# Patient Record
Sex: Female | Born: 1970 | Hispanic: No | Marital: Married | Smoking: Current every day smoker
Health system: Southern US, Community
[De-identification: ages and names within clinical notes are randomized; demographics above are authoritative.]

---

## 1998-04-13 ENCOUNTER — Other Ambulatory Visit: Admission: RE | Admit: 1998-04-13 | Discharge: 1998-04-13 | Payer: Self-pay | Admitting: Family Medicine

## 2001-11-12 ENCOUNTER — Other Ambulatory Visit: Admission: RE | Admit: 2001-11-12 | Discharge: 2001-11-12 | Payer: Self-pay | Admitting: Unknown Physician Specialty

## 2005-07-31 ENCOUNTER — Other Ambulatory Visit: Admission: RE | Admit: 2005-07-31 | Discharge: 2005-07-31 | Payer: Self-pay | Admitting: Dermatology

## 2005-08-31 ENCOUNTER — Other Ambulatory Visit: Admission: RE | Admit: 2005-08-31 | Discharge: 2005-08-31 | Payer: Self-pay | Admitting: Dermatology

## 2012-11-07 ENCOUNTER — Ambulatory Visit (INDEPENDENT_AMBULATORY_CARE_PROVIDER_SITE_OTHER): Payer: 59 | Admitting: Nurse Practitioner

## 2012-11-07 ENCOUNTER — Encounter: Payer: Self-pay | Admitting: Nurse Practitioner

## 2012-11-07 ENCOUNTER — Telehealth: Payer: Self-pay | Admitting: Nurse Practitioner

## 2012-11-07 VITALS — BP 113/72 | HR 80 | Temp 99.0°F | Ht 62.0 in | Wt 123.5 lb

## 2012-11-07 DIAGNOSIS — J329 Chronic sinusitis, unspecified: Secondary | ICD-10-CM

## 2012-11-07 MED ORDER — HYDROCODONE-HOMATROPINE 5-1.5 MG/5ML PO SYRP: 5.0000 mL | ORAL_SOLUTION | Freq: Three times a day (TID) | ORAL | Status: DC | PRN

## 2012-11-07 MED ORDER — AZITHROMYCIN 250 MG PO TABS: ORAL_TABLET | ORAL | Status: DC

## 2012-11-07 NOTE — Progress Notes (Signed)
  Subjective:    Patient ID: Emily Williams, female    DOB: [birth year], 42 y.o.   MRN: [id number]  HPI-Patient in complaining of Cough and congestion . Started 7 days ago. Has gotten worse since started. Associated symptoms include headache. He has tried [facility] allergy OTC without relief.     Review of Systems  Constitutional: Negative for fever and chills.  HENT: Positive for congestion, rhinorrhea, sneezing and sinus pressure. Negative for ear pain.   Respiratory: Positive for cough (productive green).   Neurological: Positive for headaches.       Objective:   Physical Exam  Constitutional: She appears well-developed and well-nourished.  HENT:  Head: Normocephalic.  Right Ear: Hearing, tympanic membrane, external ear and ear canal normal.  Left Ear: Tympanic membrane, external ear and ear canal normal.  Nose: Mucosal edema and rhinorrhea present. Right sinus exhibits maxillary sinus tenderness and frontal sinus tenderness. Left sinus exhibits maxillary sinus tenderness and frontal sinus tenderness.  Mouth/Throat: Posterior oropharyngeal erythema present.  Eyes: EOM are normal. Pupils are equal, round, and reactive to light.  Cardiovascular: Normal rate, normal heart sounds and intact distal pulses.   Pulmonary/Chest: Effort normal and breath sounds normal.   BP 113/72  Pulse 80  Temp(Src) 99 F (37.2 C) (Oral)  Ht 5\' 2"  (1.575 m)  Wt 123 lb 8 oz (56.019 kg)  BMI 22.58 kg/m2        Assessment & Plan:  1. Rhinosinusitis 1. Take meds as prescribed 2. Use a cool mist humidifier especially during the winter months and when heat has  been humid. 3. Use saline nose sprays frequently 4. Saline irrigations of the nose can be very helpful if done frequently.  * 4X daily for 1 week*  * Use of a nettie pot can be helpful with this. Follow directions with this* 5. Drink plenty of fluids 6. Keep thermostat turn down low 7.For any cough or congestion  Use plain Mucinex-  regular strength or max strength is fine   * Children- consult with Pharmacist for dosing 8. For fever or aces or pains- take tylenol or ibuprofen appropriate for age and weight.  * for fevers greater than 101 orally you may alternate ibuprofen and tylenol every  3 hours.   - azithromycin (ZITHROMAX Z-PAK) 250 MG tablet; As directed  Dispense: 6 each; Refill: 0 - HYDROcodone-homatropine (HYCODAN) 5-1.5 MG/5ML syrup; Take 5 mLs by mouth every 8 (eight) hours as needed for cough.  Dispense: 120 mL; Refill: 0  Emily Daphine Deutscher, FNP

## 2012-11-07 NOTE — Telephone Encounter (Signed)
Appt given for today 

## 2012-11-07 NOTE — Patient Instructions (Signed)

## 2013-05-09 ENCOUNTER — Ambulatory Visit (INDEPENDENT_AMBULATORY_CARE_PROVIDER_SITE_OTHER): Payer: 59 | Admitting: Family Medicine

## 2013-05-09 ENCOUNTER — Encounter: Payer: Self-pay | Admitting: Family Medicine

## 2013-05-09 VITALS — BP 117/68 | HR 83 | Temp 98.3°F | Wt 125.2 lb

## 2013-05-09 DIAGNOSIS — Z7189 Other specified counseling: Secondary | ICD-10-CM

## 2013-05-09 DIAGNOSIS — F17200 Nicotine dependence, unspecified, uncomplicated: Secondary | ICD-10-CM

## 2013-05-09 DIAGNOSIS — Z716 Tobacco abuse counseling: Secondary | ICD-10-CM

## 2013-05-09 DIAGNOSIS — J40 Bronchitis, not specified as acute or chronic: Secondary | ICD-10-CM

## 2013-05-09 DIAGNOSIS — J069 Acute upper respiratory infection, unspecified: Secondary | ICD-10-CM

## 2013-05-09 MED ORDER — METHYLPREDNISOLONE ACETATE 80 MG/ML IJ SUSP
80.0000 mg | Freq: Once | INTRAMUSCULAR | Status: AC
  Administered 2013-05-09: 80 mg via INTRAMUSCULAR

## 2013-05-09 MED ORDER — AZITHROMYCIN 250 MG PO TABS: ORAL_TABLET | ORAL | Status: DC

## 2013-05-09 MED ORDER — BENZONATATE 100 MG PO CAPS: 100.0000 mg | ORAL_CAPSULE | Freq: Three times a day (TID) | ORAL | Status: DC | PRN

## 2013-05-09 NOTE — Patient Instructions (Addendum)
Smoking Cessation Quitting smoking is important to your health and has many advantages. However, it is not always easy to quit since nicotine is a very addictive drug. Often times, people try 3 times or more before being able to quit. This document explains the best ways for you to prepare to quit smoking. Quitting takes hard work and a lot of effort, but you can do it. ADVANTAGES OF QUITTING SMOKING  You will live longer, feel better, and live better.  Your body will feel the impact of quitting smoking almost immediately.  Within 20 minutes, blood pressure decreases. Your pulse returns to its normal level.  After 8 hours, carbon monoxide levels in the blood return to normal. Your oxygen level increases.  After 24 hours, the chance of having a heart attack starts to decrease. Your breath, hair, and body stop smelling like smoke.  After 48 hours, damaged nerve endings begin to recover. Your sense of taste and smell improve.  After 72 hours, the body is virtually free of nicotine. Your bronchial tubes relax and breathing becomes easier.  After 2 to 12 weeks, lungs can hold more air. Exercise becomes easier and circulation improves.  The risk of having a heart attack, stroke, cancer, or lung disease is greatly reduced.  After 1 year, the risk of coronary heart disease is cut in half.  After 5 years, the risk of stroke falls to the same as a nonsmoker.  After 10 years, the risk of lung cancer is cut in half and the risk of other cancers decreases significantly.  After 15 years, the risk of coronary heart disease drops, usually to the level of a nonsmoker.  If you are pregnant, quitting smoking will improve your chances of having a healthy baby.  The people you live with, especially any children, will be healthier.  You will have extra money to spend on things other than cigarettes. QUESTIONS TO THINK ABOUT BEFORE ATTEMPTING TO QUIT You may want to talk about your answers with your  caregiver.  Why do you want to quit?  If you tried to quit in the past, what helped and what did not?  What will be the most difficult situations for you after you quit? How will you plan to handle them?  Who can help you through the tough times? Your family? Friends? A caregiver?  What pleasures do you get from smoking? What ways can you still get pleasure if you quit? Here are some questions to ask your caregiver:  How can you help me to be successful at quitting?  What medicine do you think would be best for me and how should I take it?  What should I do if I need more help?  What is smoking withdrawal like? How can I get information on withdrawal? GET READY  Set a quit date.  Change your environment by getting rid of all cigarettes, ashtrays, matches, and lighters in your home, car, or work. Do not let people smoke in your home.  Review your past attempts to quit. Think about what worked and what did not. GET SUPPORT AND ENCOURAGEMENT You have a better chance of being successful if you have help. You can get support in many ways.  Tell your family, friends, and co-workers that you are going to quit and need their support. Ask them not to smoke around you.  Get individual, group, or telephone counseling and support. Programs are available at local hospitals and health centers. Call your local health department for   information about programs in your area.  Spiritual beliefs and practices may help some smokers quit.  Download a "quit meter" on your computer to keep track of quit statistics, such as how long you have gone without smoking, cigarettes not smoked, and money saved.  Get a self-help book about quitting smoking and staying off of tobacco. LEARN NEW SKILLS AND BEHAVIORS  Distract yourself from urges to smoke. Talk to someone, go for a walk, or occupy your time with a task.  Change your normal routine. Take a different route to work. Drink tea instead of coffee.  Eat breakfast in a different place.  Reduce your stress. Take a hot bath, exercise, or read a book.  Plan something enjoyable to do every day. Reward yourself for not smoking.  Explore interactive web-based programs that specialize in helping you quit. GET MEDICINE AND USE IT CORRECTLY Medicines can help you stop smoking and decrease the urge to smoke. Combining medicine with the above behavioral methods and support can greatly increase your chances of successfully quitting smoking.  Nicotine replacement therapy helps deliver nicotine to your body without the negative effects and risks of smoking. Nicotine replacement therapy includes nicotine gum, lozenges, inhalers, nasal sprays, and skin patches. Some may be available over-the-counter and others require a prescription.  Antidepressant medicine helps people abstain from smoking, but how this works is unknown. This medicine is available by prescription.  Nicotinic receptor partial agonist medicine simulates the effect of nicotine in your brain. This medicine is available by prescription. Ask your caregiver for advice about which medicines to use and how to use them based on your health history. Your caregiver will tell you what side effects to look out for if you choose to be on a medicine or therapy. Carefully read the information on the package. Do not use any other product containing nicotine while using a nicotine replacement product.  RELAPSE OR DIFFICULT SITUATIONS Most relapses occur within the first 3 months after quitting. Do not be discouraged if you start smoking again. Remember, most people try several times before finally quitting. You may have symptoms of withdrawal because your body is used to nicotine. You may crave cigarettes, be irritable, feel very hungry, cough often, get headaches, or have difficulty concentrating. The withdrawal symptoms are only temporary. They are strongest when you first quit, but they will go away within  10 14 days. To reduce the chances of relapse, try to:  Avoid drinking alcohol. Drinking lowers your chances of successfully quitting.  Reduce the amount of caffeine you consume. Once you quit smoking, the amount of caffeine in your body increases and can give you symptoms, such as a rapid heartbeat, sweating, and anxiety.  Avoid smokers because they can make you want to smoke.  Do not let weight gain distract you. Many smokers will gain weight when they quit, usually less than 10 pounds. Eat a healthy diet and stay active. You can always lose the weight gained after you quit.  Find ways to improve your mood other than smoking. FOR MORE INFORMATION  www.smokefree.gov  Document Released: 06/13/2001 Document Revised: 12/19/2011 Document Reviewed: 09/28/2011 [employer] Patient Information 2014 [employer]. Bronchitis Bronchitis is the body's way of reacting to injury and/or infection (inflammation) of the bronchi. Bronchi are the air tubes that extend from the windpipe into the lungs. If the inflammation becomes severe, it may cause shortness of breath. CAUSES  Inflammation may be caused by:  A virus.  Germs (bacteria).  Dust.  Allergens.    Pollutants and many other irritants. The cells lining the bronchial tree are covered with tiny hairs (cilia). These constantly beat upward, away from the lungs, toward the mouth. This keeps the lungs free of pollutants. When these cells become too irritated and are unable to do their job, mucus begins to develop. This causes the characteristic cough of bronchitis. The cough clears the lungs when the cilia are unable to do their job. Without either of these protective mechanisms, the mucus would settle in the lungs. Then you would develop pneumonia. Smoking is a common cause of bronchitis and can contribute to pneumonia. Stopping this habit is the single most important thing you can do to help yourself. TREATMENT   Your caregiver may prescribe an  antibiotic if the cough is caused by bacteria. Also, medicines that open up your airways make it easier to breathe. Your caregiver may also recommend or prescribe an expectorant. It will loosen the mucus to be coughed up. Only take over-the-counter or prescription medicines for pain, discomfort, or fever as directed by your caregiver.  Removing whatever causes the problem (smoking, for example) is critical to preventing the problem from getting worse.  Cough suppressants may be prescribed for relief of cough symptoms.  Inhaled medicines may be prescribed to help with symptoms now and to help prevent problems from returning.  For those with recurrent (chronic) bronchitis, there may be a need for steroid medicines. SEEK IMMEDIATE MEDICAL CARE IF:   During treatment, you develop more pus-like mucus (purulent sputum).  You have a fever.  You become progressively more ill.  You have increased difficulty breathing, wheezing, or shortness of breath. It is necessary to seek immediate medical care if you are elderly or sick from any other disease. MAKE SURE YOU:   Understand these instructions.  Will watch your condition.  Will get help right away if you are not doing well or get worse. Document Released: 06/19/2005 Document Revised: 02/19/2013 Document Reviewed: 02/11/2013 [employer] Patient Information 2014 [employer].  

## 2013-05-09 NOTE — Progress Notes (Signed)
  Subjective:    Patient ID: Emily Williams, female    DOB: 09-30-1970, 42 y.o.   MRN: [id number]  HPI URI Symptoms Onset: 5-6 days  Description: rhinorrhea, nasal congestion, cough, wheezing  Modifying factors:  1 ppd smoker   Symptoms Nasal discharge: yes Fever: no Sore throat: minimal  Cough: yes Wheezing: yes Ear pain: no GI symptoms: no Sick contacts: yes  Red Flags  Stiff neck: no Dyspnea: no Rash: no Swallowing difficulty: no  Sinusitis Risk Factors Headache/face pain: no Double sickening: no tooth pain: no  Allergy Risk Factors Sneezing: no Itchy scratchy throat: no Seasonal symptoms: no  Flu Risk Factors Headache: no muscle aches: no severe fatigue: no     Review of Systems  All other systems reviewed and are negative.       Objective:   Physical Exam  Constitutional: She appears well-developed and well-nourished.  HENT:  Head: Normocephalic and atraumatic.  Eyes: Conjunctivae are normal. Pupils are equal, round, and reactive to light.  Neck: Normal range of motion. Neck supple.  Cardiovascular: Normal rate and regular rhythm.   Pulmonary/Chest: Effort normal.  Faint wheezes in apices    Abdominal: Soft.  Musculoskeletal: Normal range of motion.  Neurological: She is alert.  Skin: Skin is warm.          Assessment & Plan:  URI (upper respiratory infection)  Bronchitis - Plan: benzonatate (TESSALON) 100 MG capsule, azithromycin (ZITHROMAX) 250 MG tablet  Suspect likely viral process with secondary obstructive lung disease exacerbation (25+ pack year smoking history with wheezing).  Depomedrol 80mg  IM x1.  Wheezing mild with no resp distress/increased WOB Tessalon perles for cough Zpak if sxs fail to improve in 5-7 days or worsen.  Discussed smoking cessation at length.  Follow up as needed.

## 2013-05-09 NOTE — Addendum Note (Signed)
Addended by: Gwenith Daily on: 05/09/2013 11:15 AM   Modules accepted: Orders

## 2013-09-08 ENCOUNTER — Encounter: Payer: Self-pay | Admitting: Family Medicine

## 2013-09-08 ENCOUNTER — Ambulatory Visit (INDEPENDENT_AMBULATORY_CARE_PROVIDER_SITE_OTHER): Payer: 59 | Admitting: Family Medicine

## 2013-09-08 ENCOUNTER — Encounter (INDEPENDENT_AMBULATORY_CARE_PROVIDER_SITE_OTHER): Payer: Self-pay

## 2013-09-08 VITALS — BP 107/62 | HR 90 | Temp 99.0°F | Ht 62.0 in | Wt 121.8 lb

## 2013-09-08 DIAGNOSIS — K649 Unspecified hemorrhoids: Secondary | ICD-10-CM

## 2013-09-08 DIAGNOSIS — R197 Diarrhea, unspecified: Secondary | ICD-10-CM

## 2013-09-08 MED ORDER — HYDROCORTISONE ACETATE 25 MG RE SUPP: 25.0000 mg | Freq: Two times a day (BID) | RECTAL | Status: AC

## 2013-09-08 MED ORDER — DIPHENOXYLATE-ATROPINE 2.5-0.025 MG PO TABS: 2.0000 | ORAL_TABLET | Freq: Four times a day (QID) | ORAL | Status: AC | PRN

## 2013-09-08 NOTE — Progress Notes (Signed)
   Subjective:    Patient ID: Emily Williams, female    DOB: 07-29-1970, 43 y.o.   MRN: [id number]  HPI This 43 y.o. female presents for evaluation of diarrhea and rectal bleeding for 2 days.   Review of Systems C/o rectal bleeding and diarrhea No chest pain, SOB, HA, dizziness, vision change, N/V, constipation, dysuria, urinary urgency or frequency, myalgias, arthralgias or rash.     Objective:   Physical Exam Vital signs noted  Well developed well nourished female.  HEENT - Head atraumatic Normocephalic                Eyes - PERRLA, Conjuctiva - clear Sclera- Clear EOMI                Ears - EAC's Wnl TM's Wnl Gross Hearing WNL                Throat - oropharanx wnl Respiratory - Lungs CTA bilateral Cardiac - RRR S1 and S2 without murmur GI - Abdomen soft Nontender and bowel sounds active x 4 Rectal - deferred       Assessment & Plan:  Hemorrhoids - Plan: hydrocortisone (ANUSOL-HC) 25 MG suppository  Diarrhea - Plan: diphenoxylate-atropine (LOMOTIL) 2.5-0.025 MG per tablet  If rectal bleeding persists then follow up  Deatra CanterWilliam J Shere Eisenhart FNP

## 2013-09-10 ENCOUNTER — Ambulatory Visit (INDEPENDENT_AMBULATORY_CARE_PROVIDER_SITE_OTHER): Payer: 59 | Admitting: Family Medicine

## 2013-09-10 ENCOUNTER — Encounter: Payer: Self-pay | Admitting: Family Medicine

## 2013-09-10 ENCOUNTER — Telehealth: Payer: Self-pay | Admitting: Family Medicine

## 2013-09-10 VITALS — BP 100/55 | HR 84 | Temp 99.4°F | Ht 62.0 in | Wt 121.2 lb

## 2013-09-10 DIAGNOSIS — R5381 Other malaise: Secondary | ICD-10-CM

## 2013-09-10 DIAGNOSIS — K625 Hemorrhage of anus and rectum: Secondary | ICD-10-CM

## 2013-09-10 DIAGNOSIS — R531 Weakness: Secondary | ICD-10-CM

## 2013-09-10 DIAGNOSIS — R5383 Other fatigue: Secondary | ICD-10-CM

## 2013-09-10 DIAGNOSIS — R197 Diarrhea, unspecified: Secondary | ICD-10-CM

## 2013-09-10 DIAGNOSIS — K649 Unspecified hemorrhoids: Secondary | ICD-10-CM

## 2013-09-10 LAB — POCT CBC
Granulocyte percent: 61.2 %G (ref 37–80)
HCT, POC: 42.7 % (ref 37.7–47.9)
Hemoglobin: 13.6 g/dL (ref 12.2–16.2)
Lymph, poc: 3.4 (ref 0.6–3.4)
MCH, POC: 29.8 pg (ref 27–31.2)
MCHC: 31.9 g/dL (ref 31.8–35.4)
MCV: 93.4 fL (ref 80–97)
MPV: 6.7 fL (ref 0–99.8)
POC Granulocyte: 6.1 (ref 2–6.9)
POC LYMPH PERCENT: 33.9 %L (ref 10–50)
Platelet Count, POC: 331 10*3/uL (ref 142–424)
RBC: 4.6 M/uL (ref 4.04–5.48)
RDW, POC: 12.5 %
WBC: 9.9 10*3/uL (ref 4.6–10.2)

## 2013-09-10 MED ORDER — HYOSCYAMINE SULFATE 0.125 MG SL SUBL: 0.1250 mg | SUBLINGUAL_TABLET | SUBLINGUAL | Status: AC | PRN

## 2013-09-10 NOTE — Telephone Encounter (Signed)
Appt scheduled for today. Patient aware.

## 2013-09-10 NOTE — Progress Notes (Signed)
   Subjective:    Patient ID: Emily Williams, female    DOB: 1971-01-28, 43 y.o.   MRN: [id number]  HPI  This 43 y.o. female presents for evaluation of rectal bleeding and diarrhea.  She has been taking lomotil because she was having more than 6 bm's a day and she is only having 3 bm's now but she is weak and she wiped and had blood on her tissue.  Review of Systems C/o rectal bleeding and diarrhea   No chest pain, SOB, HA, dizziness, vision change, N/V, diarrhea, constipation, dysuria, urinary urgency or frequency, myalgias, arthralgias or rash.  Objective:   Physical Exam  Vital signs noted  Well developed well nourished female.  HEENT - Head atraumatic Normocephalic                Eyes - PERRLA, Conjuctiva - clear Sclera- Clear EOMI                Ears - EAC's Wnl TM's Wnl Gross Hearing WNL                Throat - oropharanx wnl Respiratory - Lungs CTA bilateral Cardiac - RRR S1 and S2 without murmur GI - Abdomen soft Nontender and bowel sounds active x 4 Extremities - No edema. Neuro - Grossly intact.      Assessment & Plan:  Hemorrhoids - Plan: POCT CBC, CMP14+EGFR, hyoscyamine (LEVSIN/SL) 0.125 MG SL tablet  Weakness - Plan: POCT CBC, CMP14+EGFR, hyoscyamine (LEVSIN/SL) 0.125 MG SL tablet  Diarrhea - Plan: POCT CBC, CMP14+EGFR, hyoscyamine (LEVSIN/SL) 0.125 MG SL tablet  Rectal bleeding - Plan: POCT CBC, CMP14+EGFR, hyoscyamine (LEVSIN/SL) 0.125 MG SL tablet  Follow up prn  Lysbeth Penner FNP

## 2013-09-11 LAB — CMP14+EGFR
ALT: 12 IU/L (ref 0–32)
AST: 20 IU/L (ref 0–40)
Albumin/Globulin Ratio: 2.8 — ABNORMAL HIGH (ref 1.1–2.5)
Albumin: 4.7 g/dL (ref 3.5–5.5)
Alkaline Phosphatase: 81 IU/L (ref 39–117)
BUN/Creatinine Ratio: 11 (ref 9–23)
BUN: 8 mg/dL (ref 6–24)
CO2: 23 mmol/L (ref 18–29)
Calcium: 9.5 mg/dL (ref 8.7–10.2)
Chloride: 100 mmol/L (ref 97–108)
Creatinine, Ser: 0.73 mg/dL (ref 0.57–1.00)
GFR calc Af Amer: 116 mL/min/{1.73_m2} (ref >59)
GFR calc non Af Amer: 100 mL/min/{1.73_m2} (ref >59)
Globulin, Total: 1.7 g/dL (ref 1.5–4.5)
Glucose: 81 mg/dL (ref 65–99)
Potassium: 3.7 mmol/L (ref 3.5–5.2)
Sodium: 140 mmol/L (ref 134–144)
Total Bilirubin: 0.5 mg/dL (ref 0.0–1.2)
Total Protein: 6.4 g/dL (ref 6.0–8.5)

## 2013-09-16 DIAGNOSIS — Z0289 Encounter for other administrative examinations: Secondary | ICD-10-CM

## 2013-09-29 ENCOUNTER — Telehealth: Payer: Self-pay | Admitting: Family Medicine

## 2013-09-29 NOTE — Telephone Encounter (Signed)
Ready, patient aware

## 2014-03-30 ENCOUNTER — Encounter (INDEPENDENT_AMBULATORY_CARE_PROVIDER_SITE_OTHER): Payer: Self-pay

## 2014-03-30 ENCOUNTER — Ambulatory Visit (INDEPENDENT_AMBULATORY_CARE_PROVIDER_SITE_OTHER): Payer: 59 | Admitting: Family Medicine

## 2014-03-30 ENCOUNTER — Encounter: Payer: Self-pay | Admitting: Family Medicine

## 2014-03-30 VITALS — BP 115/71 | HR 83 | Temp 99.0°F | Ht 62.0 in | Wt 119.6 lb

## 2014-03-30 DIAGNOSIS — M545 Low back pain, unspecified: Secondary | ICD-10-CM

## 2014-03-30 DIAGNOSIS — Z23 Encounter for immunization: Secondary | ICD-10-CM

## 2014-03-30 MED ORDER — CYCLOBENZAPRINE HCL 10 MG PO TABS: 10.0000 mg | ORAL_TABLET | Freq: Three times a day (TID) | ORAL | Status: AC | PRN

## 2014-03-30 MED ORDER — KETOROLAC TROMETHAMINE 30 MG/ML IJ SOLN
30.0000 mg | Freq: Once | INTRAMUSCULAR | Status: AC
  Administered 2014-03-30: 30 mg via INTRAMUSCULAR

## 2014-03-30 NOTE — Progress Notes (Signed)
   Subjective:    Patient ID: Emily Williams, female    DOB: 01/10/1971, 43 y.o.   MRN: [id number]  HPI This 43 y.o. female presents for evaluation of left sided lumbar back pain.  She has been taking motrin otc and is not getting better.  She works as a Doctor, hospital.  She is not able to do her job because the pain is severe and she is having difficulty walking..   Review of Systems C/o back pain   No chest pain, SOB, HA, dizziness, vision change, N/V, diarrhea, constipation, dysuria, urinary urgency or frequency or rash.  Objective:   Physical Exam  Vital signs noted  Well developed well nourished female.  HEENT - Head atraumatic Normocephalic Respiratory - Lungs CTA bilateral Cardiac - RRR S1 and S2 without murmur GI - Abdomen soft Nontender and bowel sounds active x 4 MS - TTP lumbar spine on left, decreased ROM lumbar spine.      Assessment & Plan:  Left-sided low back pain without sciatica - Plan: cyclobenzaprine (FLEXERIL) 10 MG tablet, ketorolac (TORADOL) 30 MG/ML injection 30 mg  Need for prophylactic vaccination and inoculation against influenza  Continue motrin otc and take 3 days off and follow up if not better.  Deatra Canter FNP

## 2014-04-02 ENCOUNTER — Ambulatory Visit (INDEPENDENT_AMBULATORY_CARE_PROVIDER_SITE_OTHER): Payer: 59 | Admitting: Family Medicine

## 2014-04-02 ENCOUNTER — Encounter: Payer: Self-pay | Admitting: Family Medicine

## 2014-04-02 ENCOUNTER — Ambulatory Visit (INDEPENDENT_AMBULATORY_CARE_PROVIDER_SITE_OTHER): Payer: 59

## 2014-04-02 VITALS — BP 102/64 | HR 96 | Temp 98.4°F | Ht 62.0 in | Wt 119.0 lb

## 2014-04-02 DIAGNOSIS — M545 Low back pain, unspecified: Secondary | ICD-10-CM

## 2014-04-02 NOTE — Progress Notes (Signed)
   Subjective:    Patient ID: Emily GrammesAngela Kemppainen, female    DOB: [birth year], 43 y.o.   MRN: [id number]  HPI  C/o back pain.  She was seen 3 days ago and states she is not better.  She would like work note for 2 more days.  Review of Systems    No chest pain, SOB, HA, dizziness, vision change, N/V, diarrhea, constipation, dysuria, urinary urgency or frequency, myalgias, arthralgias or rash.  Objective:   Physical Exam  MS - Decreased ROM LS spine and TTP left lumbar muscles      Assessment & Plan:  Left-sided low back pain without sciatica - Plan: DG Lumbar Spine 2-3 Views, Ambulatory referral to Physical Therapy  Deatra CanterWilliam J Dnya Hickle FNP

## 2014-04-06 DIAGNOSIS — Z6379 Other stressful life events affecting family and household: Secondary | ICD-10-CM

## 2014-04-08 ENCOUNTER — Ambulatory Visit: Payer: 59 | Attending: Family Medicine | Admitting: Physical Therapy

## 2014-04-08 DIAGNOSIS — M5440 Lumbago with sciatica, unspecified side: Secondary | ICD-10-CM | POA: Insufficient documentation

## 2014-04-08 DIAGNOSIS — Z5189 Encounter for other specified aftercare: Secondary | ICD-10-CM | POA: Insufficient documentation

## 2014-04-16 ENCOUNTER — Ambulatory Visit: Payer: 59 | Admitting: Physical Therapy

## 2014-04-16 DIAGNOSIS — Z5189 Encounter for other specified aftercare: Secondary | ICD-10-CM | POA: Diagnosis not present

## 2016-04-17 IMAGING — CR DG LUMBAR SPINE 2-3V
2 series · 2 of 2 positions shown · non-contrast
Comparison: None.

CLINICAL DATA: Several week history of progressive atraumatic low
back pain with no neurologic symptoms ; initial visit

EXAM:
LUMBAR SPINE - 2-3 VIEW

[view not recorded (1 of 2)]
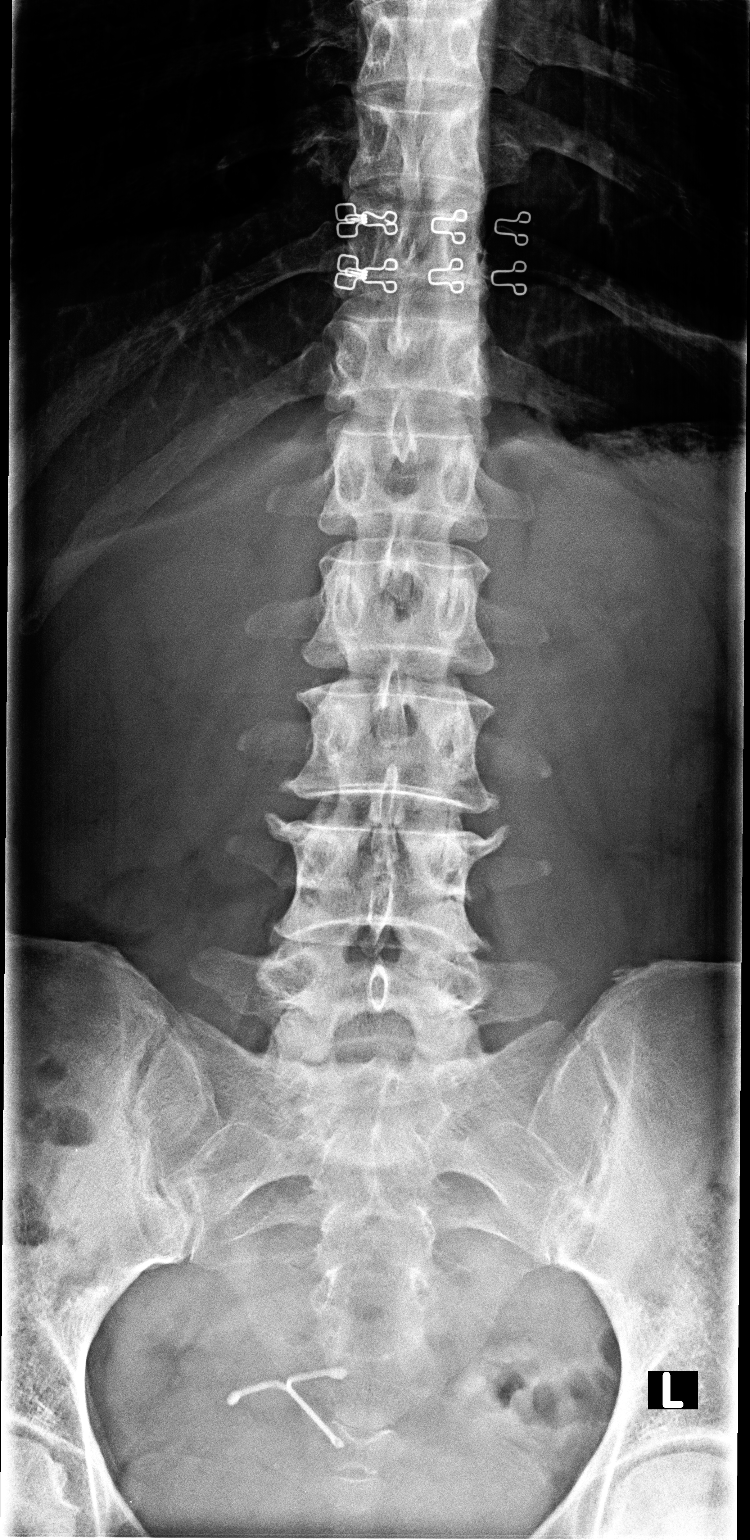

[view not recorded (2 of 2)]
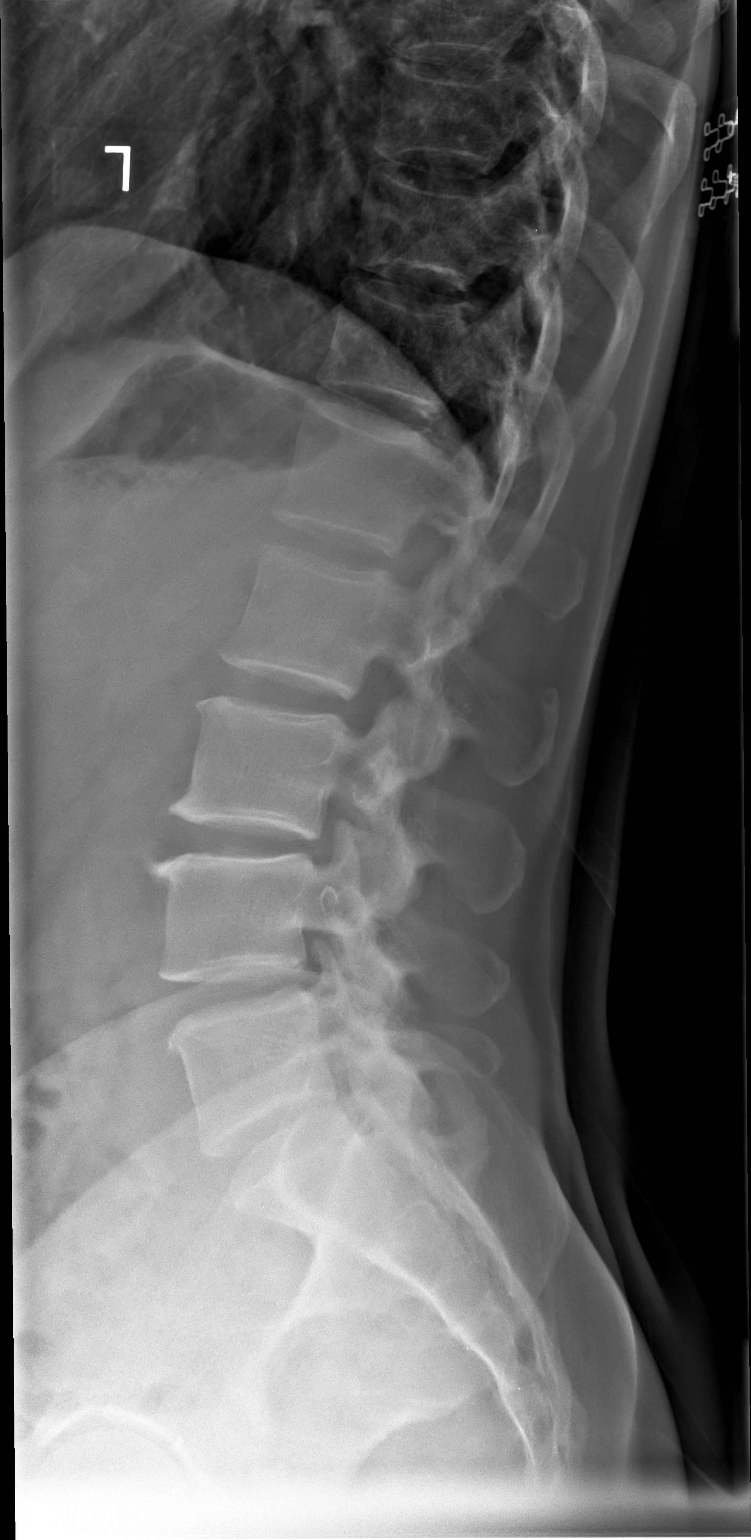

[2 of 2 positions shown; findings below may reference images not displayed]

FINDINGS: The lumbar vertebral bodies are preserved in height. The
intervertebral disc space heights are reasonably well maintained.
There is no spondylolisthesis. The facet joints are unremarkable.
There small anterior endplate spurs at L3-4. The pedicles and
transverse processes are intact. The observed portions of the sacrum
are normal. An IUD is present.
IMPRESSION: There is no acute bony abnormality of the lumbar spine. Minimal
degenerative disc change centered L3-4 is present without
significant disc space narrowing.

## 2023-09-20 NOTE — Progress Notes (Signed)
 Patient is a longtime smoker with many pack years and has attempted the past 2 undergo a smokers CT of her chest.  She wishes this to try and once again and we have booked. The patient meets eligibility criteria including age, smoking history (20+ pack years), if a former smoker, quit in the last 15 years, and absence of signs or symptoms of lung cancer.   We conducted a shared decision-making process using a decision aid. We reviewed benefits and harms of screening, including false positives and potential need for additional diagnostic testing, the possibility of over diagnosis, and total radiation exposure.    We discussed the importance of adhering to annual LDCT screening. We also discussed the impact of comorbidities on the patient's ability or willingness to undergo diagnostic procedure(s) and treatment.   We discussed the importance of achieving/maintaining abstinence from cigarette smoking.   Based on our discussion, we have decided to begin annual lung cancer screening starting now.  She undergoes routine annual GYN assessment weighing 109 pounds her blood pressure 122/80 her head and neck unremarkable her thyroid is not enlarged she has no supraclavicular lymphadenopathy.  Self breast examinations reviewed with no discrete mass nipple discharge or skin retraction abdominally she has benign findings with no mass tenderness organomegaly nor lymphadenopathy.  On genitourinary exam she has normal external genitalia she has some vaginal atrophy noted a Pap smear is performed her bimanual exam is unremarkable.  Her rectum is grossly normal.  The patient will undergo her smoker CT of her chest imaging and otherwise attend annually or as needed.

## 2024-05-14 ENCOUNTER — Encounter (INDEPENDENT_AMBULATORY_CARE_PROVIDER_SITE_OTHER): Payer: Self-pay | Admitting: *Deleted
